# Patient Record
Sex: Female | Born: 1935 | Race: Black or African American | Hispanic: No | Smoking: Never smoker
Health system: Southern US, Community
[De-identification: ages and names within clinical notes are randomized; demographics above are authoritative.]

---

## 2019-02-14 ENCOUNTER — Other Ambulatory Visit: Payer: Self-pay | Admitting: Physician Assistant

## 2019-02-15 ENCOUNTER — Other Ambulatory Visit: Payer: Self-pay | Admitting: Physician Assistant

## 2019-02-19 ENCOUNTER — Other Ambulatory Visit: Payer: Self-pay | Admitting: Physician Assistant

## 2019-02-19 DIAGNOSIS — R5381 Other malaise: Secondary | ICD-10-CM

## 2019-02-21 ENCOUNTER — Other Ambulatory Visit: Payer: Self-pay | Admitting: Physician Assistant

## 2019-02-21 DIAGNOSIS — E2839 Other primary ovarian failure: Secondary | ICD-10-CM

## 2019-03-01 ENCOUNTER — Ambulatory Visit
Admission: RE | Admit: 2019-03-01 | Discharge: 2019-03-01 | Disposition: A | Discharge status: Home / Self Care | Payer: Medicare Other | Source: Ambulatory Visit | Attending: Physician Assistant | Admitting: Physician Assistant

## 2019-03-01 DIAGNOSIS — E2839 Other primary ovarian failure: Secondary | ICD-10-CM

## 2019-04-04 ENCOUNTER — Ambulatory Visit: Payer: Medicare Other | Admitting: Neurology

## 2019-08-09 ENCOUNTER — Ambulatory Visit: Payer: Medicare Other | Admitting: Neurology

## 2020-09-02 DIAGNOSIS — E119 Type 2 diabetes mellitus without complications: Secondary | ICD-10-CM | POA: Diagnosis not present

## 2020-09-02 DIAGNOSIS — R55 Syncope and collapse: Secondary | ICD-10-CM | POA: Diagnosis present

## 2020-09-02 DIAGNOSIS — Z7984 Long term (current) use of oral hypoglycemic drugs: Secondary | ICD-10-CM | POA: Insufficient documentation

## 2020-09-02 DIAGNOSIS — I10 Essential (primary) hypertension: Secondary | ICD-10-CM | POA: Insufficient documentation

## 2020-09-02 DIAGNOSIS — R000 Tachycardia, unspecified: Secondary | ICD-10-CM | POA: Insufficient documentation

## 2020-09-03 ENCOUNTER — Other Ambulatory Visit: Payer: Self-pay

## 2020-09-03 ENCOUNTER — Emergency Department (HOSPITAL_COMMUNITY)
Admission: EM | Admit: 2020-09-03 | Discharge: 2020-09-03 | Disposition: A | Discharge status: Home / Self Care | Payer: Medicare Other | Attending: Emergency Medicine | Admitting: Emergency Medicine

## 2020-09-03 ENCOUNTER — Encounter (HOSPITAL_COMMUNITY): Payer: Self-pay | Admitting: Emergency Medicine

## 2020-09-03 ENCOUNTER — Emergency Department (HOSPITAL_COMMUNITY): Payer: Medicare Other

## 2020-09-03 DIAGNOSIS — R55 Syncope and collapse: Secondary | ICD-10-CM

## 2020-09-03 LAB — COMPREHENSIVE METABOLIC PANEL
ALT: 17 U/L (ref 0–44)
AST: 20 U/L (ref 15–41)
Albumin: 3.6 g/dL (ref 3.5–5.0)
Alkaline Phosphatase: 87 U/L (ref 38–126)
Anion gap: 11 (ref 5–15)
BUN: 10 mg/dL (ref 8–23)
CO2: 22 mmol/L (ref 22–32)
Calcium: 9.3 mg/dL (ref 8.9–10.3)
Chloride: 106 mmol/L (ref 98–111)
Creatinine, Ser: 0.61 mg/dL (ref 0.44–1.00)
GFR, Estimated: >60 mL/min (ref >60)
Glucose, Bld: 145 mg/dL — ABNORMAL HIGH (ref 70–99)
Potassium: 3.7 mmol/L (ref 3.5–5.1)
Sodium: 139 mmol/L (ref 135–145)
Total Bilirubin: 0.2 mg/dL — ABNORMAL LOW (ref 0.3–1.2)
Total Protein: 7.8 g/dL (ref 6.5–8.1)

## 2020-09-03 LAB — CBC WITH DIFFERENTIAL/PLATELET
Abs Immature Granulocytes: 0.01 10*3/uL (ref 0.00–0.07)
Basophils Absolute: 0.1 10*3/uL (ref 0.0–0.1)
Basophils Relative: 1 %
Eosinophils Absolute: 0.1 10*3/uL (ref 0.0–0.5)
Eosinophils Relative: 1 %
HCT: 39 % (ref 36.0–46.0)
Hemoglobin: 12.6 g/dL (ref 12.0–15.0)
Immature Granulocytes: 0 %
Lymphocytes Relative: 33 %
Lymphs Abs: 2.6 10*3/uL (ref 0.7–4.0)
MCH: 28.8 pg (ref 26.0–34.0)
MCHC: 32.3 g/dL (ref 30.0–36.0)
MCV: 89.2 fL (ref 80.0–100.0)
Monocytes Absolute: 0.5 10*3/uL (ref 0.1–1.0)
Monocytes Relative: 7 %
Neutro Abs: 4.6 10*3/uL (ref 1.7–7.7)
Neutrophils Relative %: 58 %
Platelets: 240 10*3/uL (ref 150–400)
RBC: 4.37 MIL/uL (ref 3.87–5.11)
RDW: 14.4 % (ref 11.5–15.5)
WBC: 7.9 10*3/uL (ref 4.0–10.5)
nRBC: 0 % (ref 0.0–0.2)

## 2020-09-03 LAB — URINALYSIS, ROUTINE W REFLEX MICROSCOPIC
Bacteria, UA: NONE SEEN
Bilirubin Urine: NEGATIVE
Glucose, UA: NEGATIVE mg/dL
Hgb urine dipstick: NEGATIVE
Ketones, ur: NEGATIVE mg/dL
Nitrite: NEGATIVE
Protein, ur: NEGATIVE mg/dL
Specific Gravity, Urine: 1.011 (ref 1.005–1.030)
pH: 6 (ref 5.0–8.0)

## 2020-09-03 MED ORDER — LORAZEPAM 2 MG/ML IJ SOLN
0.5000 mg | Freq: Once | INTRAMUSCULAR | Status: AC | PRN
  Administered 2020-09-03: 15:00:00 0.5 mg via INTRAVENOUS
  Filled 2020-09-03: qty 1

## 2020-09-03 MED ORDER — SODIUM CHLORIDE 0.9 % IV BOLUS: 500.0000 mL | Freq: Once | INTRAVENOUS | Status: DC

## 2020-09-03 MED ORDER — SODIUM CHLORIDE 0.9 % IV BOLUS
1000.0000 mL | Freq: Once | INTRAVENOUS | Status: AC
  Administered 2020-09-03: 15:00:00 1000 mL via INTRAVENOUS

## 2020-09-03 NOTE — ED Notes (Signed)
Patient discharge instructions reviewed with the patient. The patient verbalized understanding of instructions. Patient discharged. 

## 2020-09-03 NOTE — ED Triage Notes (Signed)
Patient arrived with EMS from home after a near syncopal episode this evening , alert and oriented , denies pain /respirations unlabored , CBG= 156 by EMS . No fever or chills .

## 2020-09-03 NOTE — ED Notes (Signed)
Please call daughter Creasie Lacosse for a status update @ [phone]

## 2020-09-03 NOTE — ED Notes (Signed)
Patient transported to CT 

## 2020-09-03 NOTE — Discharge Instructions (Signed)
You came to the emergency department today to be evaluated for your near syncopal episode and feeling faint over the last week.  Your EKG, lab work, CT scan of your head and physical exam were all reassuring.  We cannot find the exact cause of your symptoms however have ruled out more concerning causes.  It is important that you follow-up with your primary care provider for further work-up and possible referral to neurology.  Please make sure to take all of your medications as prescribed by your primary care provider.  Please return to the emergency department if Have a seizure. Have unusual pain in your chest, abdomen, or back. Faint once or repeatedly. Have a severe headache. Are bleeding from your mouth or rectum, or you have black or tarry stool. Have a very fast or irregular heartbeat (palpitations). Are confused. Have trouble walking. Have severe weakness. Have vision problems.

## 2020-09-03 NOTE — ED Provider Notes (Signed)
[facility] EMERGENCY DEPARTMENT Provider Note   CSN: [id number] Arrival date & time: 09/02/20  2356     History Chief Complaint  Patient presents with  . Near Syncope    Tiffany Ramsey is a 84 y.o. female hypertension, anxiety (takes 0.75mg  klonopin daily), diabetes (takes metformin).  Patient presents with a chief complaint of near syncope.  Patient reports that for the last week she has been "feeling faint."  She reports that this feeling comes on while at rest and while ambulating.  Last night after ambulating patient reports that her "vision went black," but she denies any loss of consciousness.  Her family member who is bedside reports that the patient screamed out and they found her alert.  Patient reports that for many months that she has had headaches and what feels like "a weight on her head."  Patient denies any headache at present.  Patient family reports that her primary care doctor wanted her to have a MRI however the patient felt too claustrophobic did not have that performed.  Patient family member also reports that the patient has been reporting visual hallucinations seeing "red beads, bubbles, and snowflakes."  This has been occurring for multiple months.  Patient denies any visual hallucinations at present.    HPI     History reviewed. No pertinent past medical history.  There are no problems to display for this patient.   History reviewed. No pertinent surgical history.   OB History   No obstetric history on file.     No family history on file.  Social History   Tobacco Use  . Smoking status: Never Smoker  . Smokeless tobacco: Never Used  Substance Use Topics  . Alcohol use: Never  . Drug use: Never    Home Medications Prior to Admission medications   Not on File    Allergies    Patient has no known allergies.  Review of Systems   Review of Systems  Constitutional: Negative for chills and fever.  Eyes: Negative for visual  disturbance.  Respiratory: Negative for cough and shortness of breath.   Cardiovascular: Negative for chest pain.  Gastrointestinal: Negative for abdominal distention, abdominal pain, nausea and vomiting.  Genitourinary: Negative for difficulty urinating and dysuria.  Musculoskeletal: Positive for back pain (intermittent for multiple months; no change ) and neck pain (intermittent for multiple months; no change ).  Skin: Negative for color change and rash.  Neurological: Positive for light-headedness and headaches (intermittent for multiple months; no change ). Negative for dizziness, tremors, seizures, syncope, facial asymmetry, speech difficulty, weakness and numbness.  Psychiatric/Behavioral: Positive for hallucinations (visual for multiple months, none at present). Negative for confusion. The patient is nervous/anxious (hx of anxiety ).     Physical Exam Updated Vital Signs BP (!) 166/81 (BP Location: Left Arm)   Pulse (!) 104   Temp 97.8 F (36.6 C)   Resp (!) 22   Ht 5\' 11"  (1.803 m)   Wt 65.3 kg   SpO2 100%   BMI 20.08 kg/m   Physical Exam Vitals and nursing note reviewed.  Constitutional:      General: She is not in acute distress.    Appearance: She is not ill-appearing, toxic-appearing or diaphoretic.  HENT:     Head: Normocephalic and atraumatic.  Eyes:     General: No scleral icterus.       Right eye: No discharge.        Left eye: No discharge.  Extraocular Movements: Extraocular movements intact.     Pupils: Pupils are equal, round, and reactive to light.  Cardiovascular:     Rate and Rhythm: Tachycardia present.     Heart sounds: Normal heart sounds.  Pulmonary:     Effort: Pulmonary effort is normal.  Abdominal:     General: There is no distension.     Palpations: Abdomen is soft.     Tenderness: There is no abdominal tenderness.  Musculoskeletal:     Cervical back: Normal range of motion and neck supple. No deformity, rigidity, tenderness or bony  tenderness. No spinous process tenderness.     Thoracic back: No deformity, tenderness or bony tenderness.     Lumbar back: No deformity, tenderness or bony tenderness.     Right lower leg: No edema.     Left lower leg: No edema.  Skin:    General: Skin is warm and dry.     Coloration: Skin is not jaundiced.  Neurological:     General: No focal deficit present.     Mental Status: She is alert.     GCS: GCS eye subscore is 4. GCS verbal subscore is 5. GCS motor subscore is 6.     Cranial Nerves: No cranial nerve deficit or facial asymmetry.     Sensory: Sensation is intact.     Motor: No weakness, tremor, seizure activity or pronator drift.     Coordination: Romberg sign negative. Finger-Nose-Finger Test normal.     Gait: Gait is intact. Gait normal.     Comments: CN II-XII intact, equal grip strength, +5 strength to bilateral upper and lower extremities   Psychiatric:        Behavior: Behavior is cooperative.     ED Results / Procedures / Treatments   Labs (all labs ordered are listed, but only abnormal results are displayed) Labs Reviewed  COMPREHENSIVE METABOLIC PANEL - Abnormal; Notable for the following components:      Result Value   Glucose, Bld 145 (*)    Total Bilirubin 0.2 (*)    All other components within normal limits  URINALYSIS, ROUTINE W REFLEX MICROSCOPIC - Abnormal; Notable for the following components:   Leukocytes,Ua MODERATE (*)    All other components within normal limits  URINE CULTURE  CBC WITH DIFFERENTIAL/PLATELET    EKG EKG Interpretation  Date/Time:  Thursday September 03 2020 00:11:00 EST Ventricular Rate:  109 PR Interval:  142 QRS Duration: 92 QT Interval:  342 QTC Calculation: 460 R Axis:   4 Text Interpretation: Sinus tachycardia Minimal voltage criteria for LVH, may be normal variant ( R in aVL ) Borderline ECG No old tracing to compare Confirmed by Pricilla Loveless [id number]) on 09/03/2020 12:36:20 PM   Radiology CT Head Wo  Contrast  Result Date: 09/03/2020 CLINICAL DATA:  Headache, new or worsening.  Near syncopal episode. EXAM: CT HEAD WITHOUT CONTRAST TECHNIQUE: Contiguous axial images were obtained from the base of the skull through the vertex without intravenous contrast. COMPARISON:  None. FINDINGS: Brain: No evidence of acute large vascular territory infarction, hemorrhage, hydrocephalus, extra-axial collection or mass lesion/mass effect. Moderate patchy white matter hypoattenuation, most likely related to chronic microvascular ischemic disease. Mild generalized cerebral volume loss with ex vacuo ventricular dilation. Vascular: Calcific atherosclerosis. No hyperdense vessel identified. Skull: No acute fracture. Sinuses/Orbits: Mild paranasal sinus mucosal thickening without air-fluid levels. Unremarkable orbits. Other: No mastoid effusions. IMPRESSION: 1. No evidence of acute intracranial abnormality. 2. Moderate chronic microvascular ischemic change. Electronically Signed  By: Feliberto Harts MD   On: 09/03/2020 14:01    Procedures Procedures (including critical care time)  Medications Ordered in ED Medications  LORazepam (ATIVAN) injection 0.5 mg (0.5 mg Intravenous Given 09/03/20 1430)  sodium chloride 0.9 % bolus 1,000 mL (1,000 mLs Intravenous New Bag/Given 09/03/20 1442)    ED Course  I have reviewed the triage vital signs and the nursing notes.  Pertinent labs & imaging results that were available during my care of the patient were reviewed by me and considered in my medical decision making (see chart for details).    MDM Rules/Calculators/A&P                          Alert 84 year old female in no acute distress, nontoxic appearing.  Presents after near syncopal episode yesterday and a week of episodes of "feeling faint."  Patient reports that yesterday after ambulating her "vision went black."  She denies any loss of consciousness or seizure activity.  Family member was at bedside, reports  that patient screamed out and she came into the bedroom and found the patient alert.  Patient reports she has also had headaches and visual hallucinations intermittently over the past "few months."  At present patient denies any headache, visual hallucinations, lightheadedness or dizziness.  Patient showed no focal neurological deficits on physical exam.    EKG showed sinus tachycardia.  Do not think this is attributed to PE as patient has no shortness of breath, chest pain, recent surgeries, recent immobilization, active cancer, history of DVT/PE.  Will give 1 L fluid bolus and reassess.  CMP showed glucose at 145, CBC was unremarkable.  UA ordered and pending.  Noncontrast head CT ordered  and pending.  Orthostatic vitals pending.    CT scan shows no acute intracranial abnormality, moderate chronic microvascular ischemic change.   Urinalysis shows no sign of acute UTI.    Patient's pulse rate was observed to proved after receiving 1 L fluid bolus.  Her sinus tachycardia was likely secondary to dehydration.  Discussed results, findings, treatment and follow up. Patient advised of return precautions. Patient and family member at bedside verbalized understanding and agreed with plan.   Patient was discussed with and evaluated by Dr. Criss Alvine.    Final Clinical Impression(s) / ED Diagnoses Final diagnoses:  Near syncope    Rx / DC Orders ED Discharge Orders    None       Berneice Heinrich 09/03/20 1530    Pricilla Loveless, MD 09/06/20 [phone]

## 2020-09-04 LAB — URINE CULTURE

## 2022-12-30 IMAGING — CT CT HEAD W/O CM
3 of 4 series · 14 of 47 positions shown, 16 images · non-contrast
Comparison: None.

CLINICAL DATA: Headache, new or worsening.  Near syncopal episode.

EXAM:
CT HEAD WITHOUT CONTRAST
TECHNIQUE: Contiguous axial images were obtained from the base of the skull
through the vertex without intravenous contrast.

[Series 4: head 2.0 h70h · axial · 0.41mm/px · z∈[-107,+11]mm · 8 of 75 slices shown, 10 images]
[im 8/75  brain]
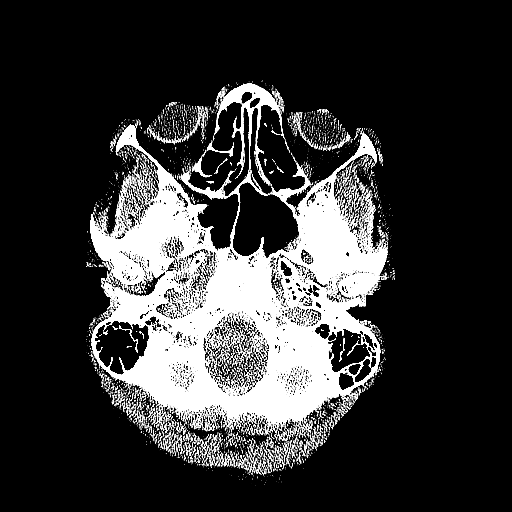
[im 8/75  bone]
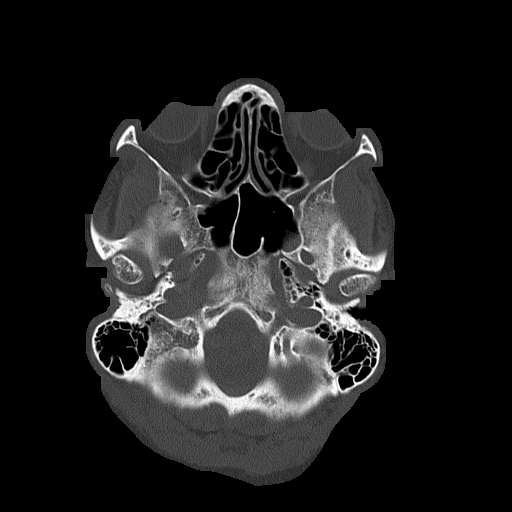
[im 15/75  brain]
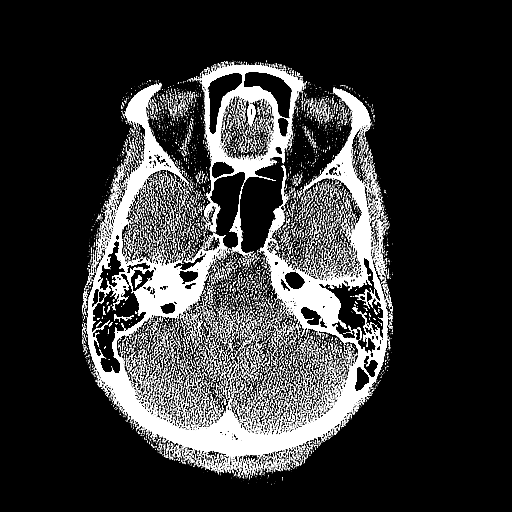
[im 23/75  brain]
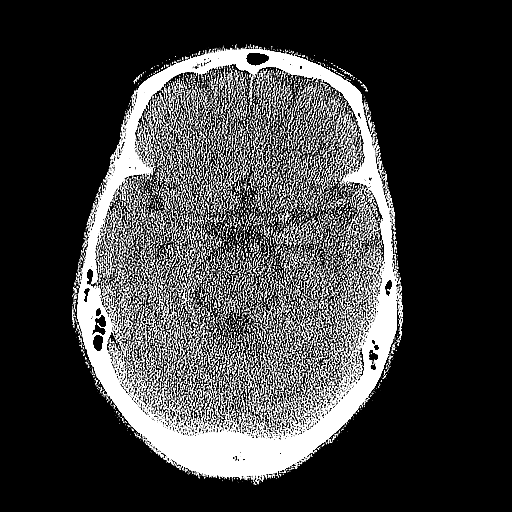
[im 34/75  brain]
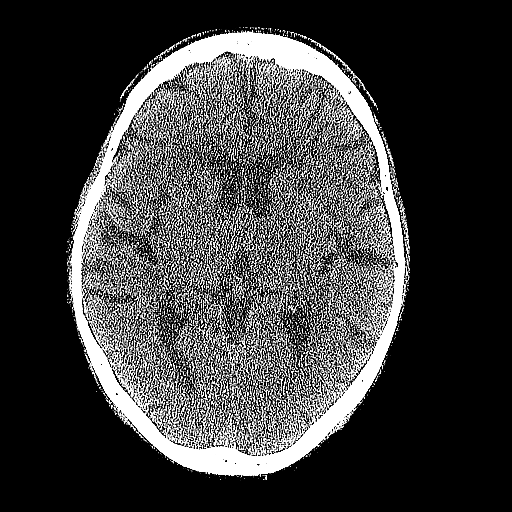
[im 41/75  brain]
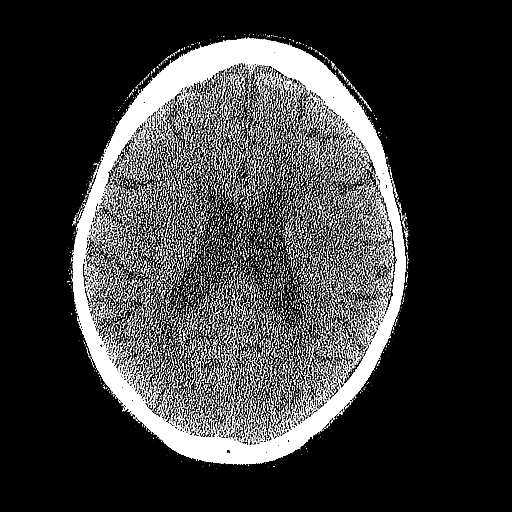
[im 41/75  bone]
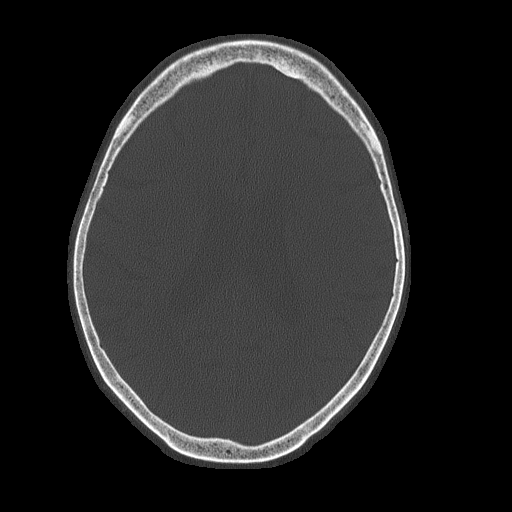
[im 52/75  brain]
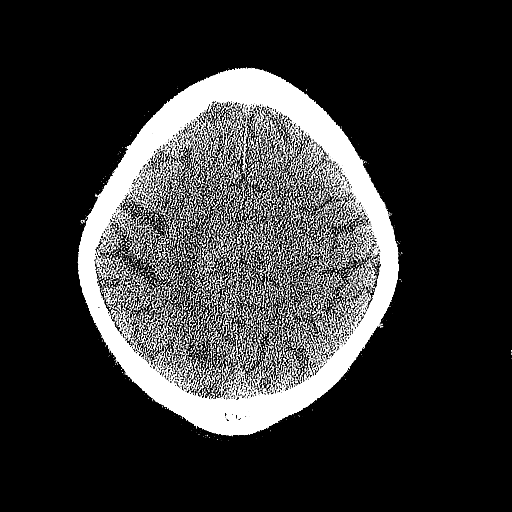
[im 60/75  brain]
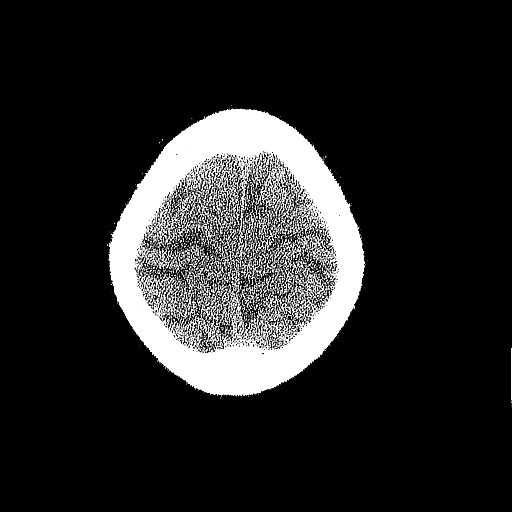
[im 67/75  brain]
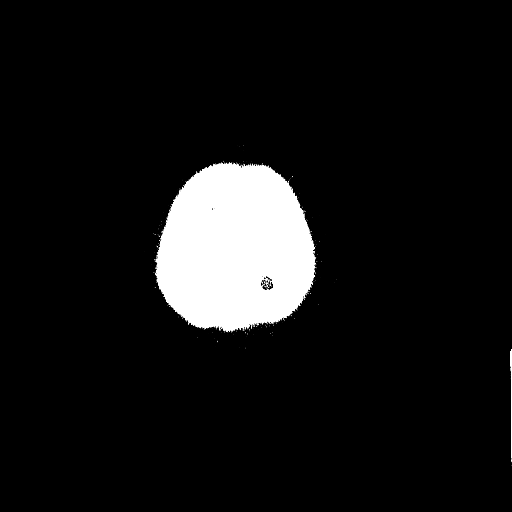

[Series 5: head 3.0 mpr cor · coronal · 0.29mm/px · 3 of 67 slices shown]
[im 23/67  brain]
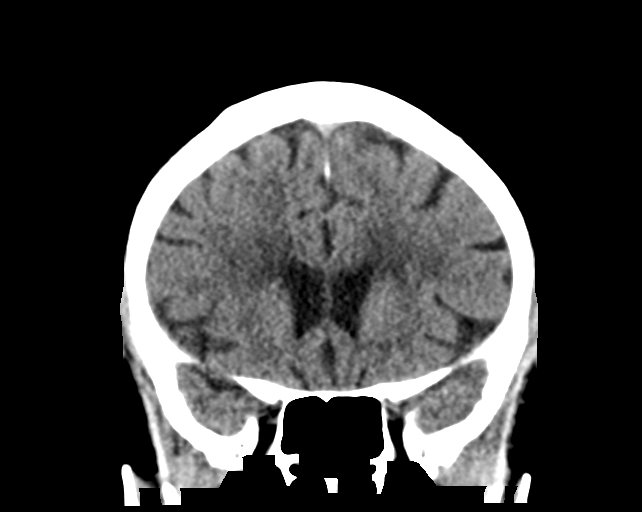
[im 30/67  brain]
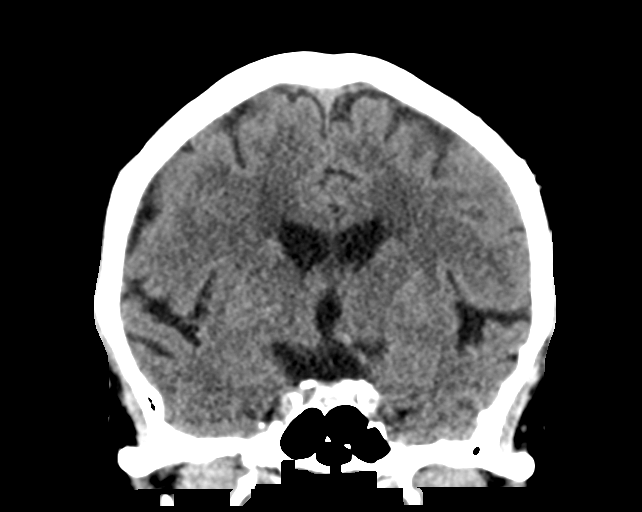
[im 37/67  brain]
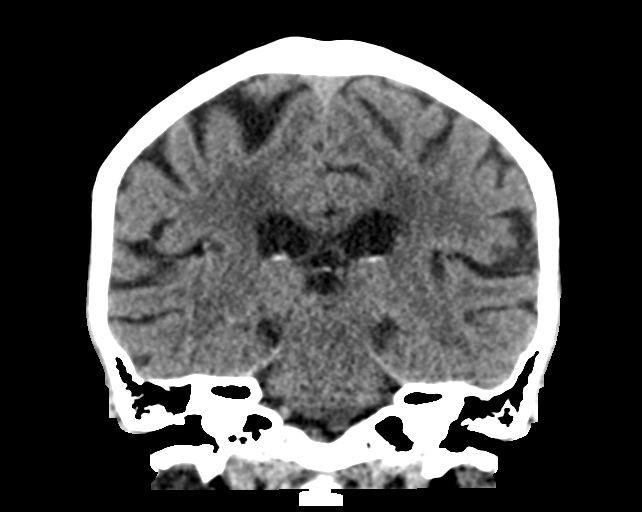

[Series 6: head 3.0 mpr sag · sagittal · 0.29mm/px · 3 of 67 slices shown]
[im 23/67  brain]
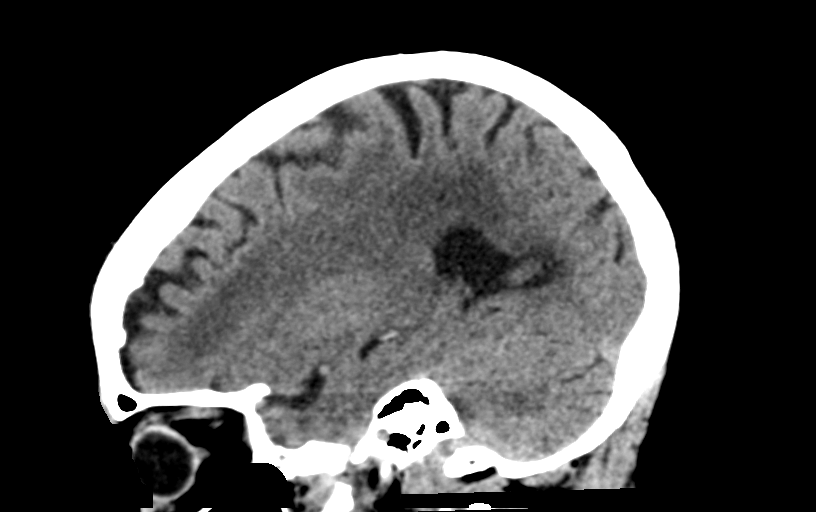
[im 34/67  brain]
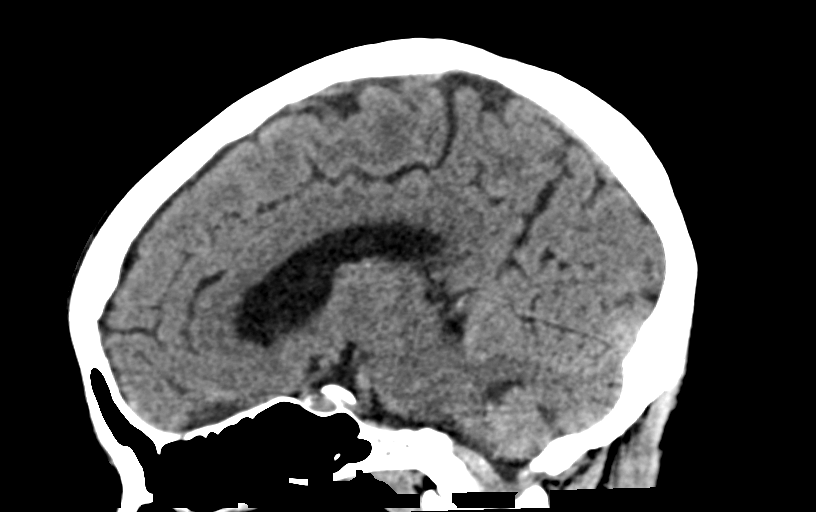
[im 45/67  brain]
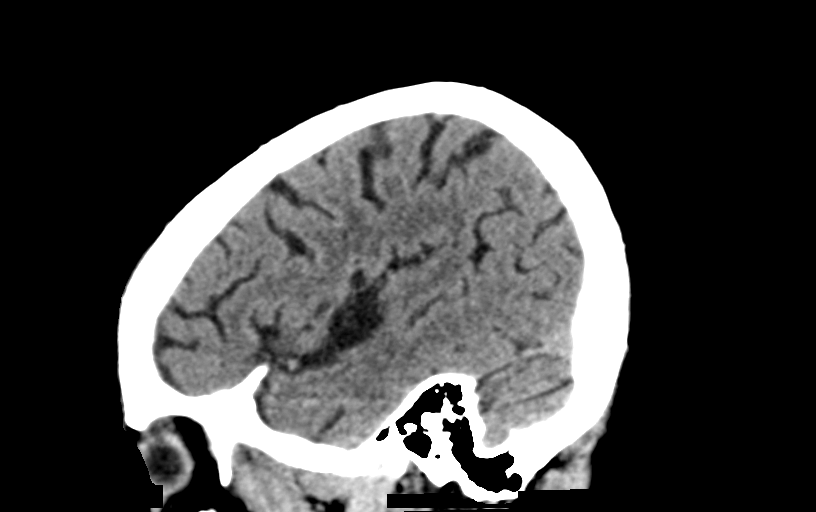

[14 of 47 positions shown; findings below may reference images not displayed]

FINDINGS: Brain: No evidence of acute large vascular territory infarction,
hemorrhage, hydrocephalus, extra-axial collection or mass
lesion/mass effect. Moderate patchy white matter hypoattenuation,
most likely related to chronic microvascular ischemic disease. Mild
generalized cerebral volume loss with ex vacuo ventricular dilation.

Vascular: Calcific atherosclerosis. No hyperdense vessel identified.

Skull: No acute fracture.

Sinuses/Orbits: Mild paranasal sinus mucosal thickening without
air-fluid levels. Unremarkable orbits.

Other: No mastoid effusions.
IMPRESSION: 1. No evidence of acute intracranial abnormality.
2. Moderate chronic microvascular ischemic change.
